# Patient Record
Sex: Male | Born: 2000 | Race: White | Hispanic: Yes | Marital: Single | Smoking: Never smoker
Health system: Southern US, Community
[De-identification: ages and names within clinical notes are randomized; demographics above are authoritative.]

---
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
## 2022-10-04 IMAGING — CR DG CHEST 2V
2 series · 2 of 2 positions shown · non-contrast
Comparison: None.

CLINICAL DATA: Pt c/o back pain and cough x 1 day. No hx of heart
or lung problems.

EXAM:
CHEST - 2 VIEW

[chest pa]
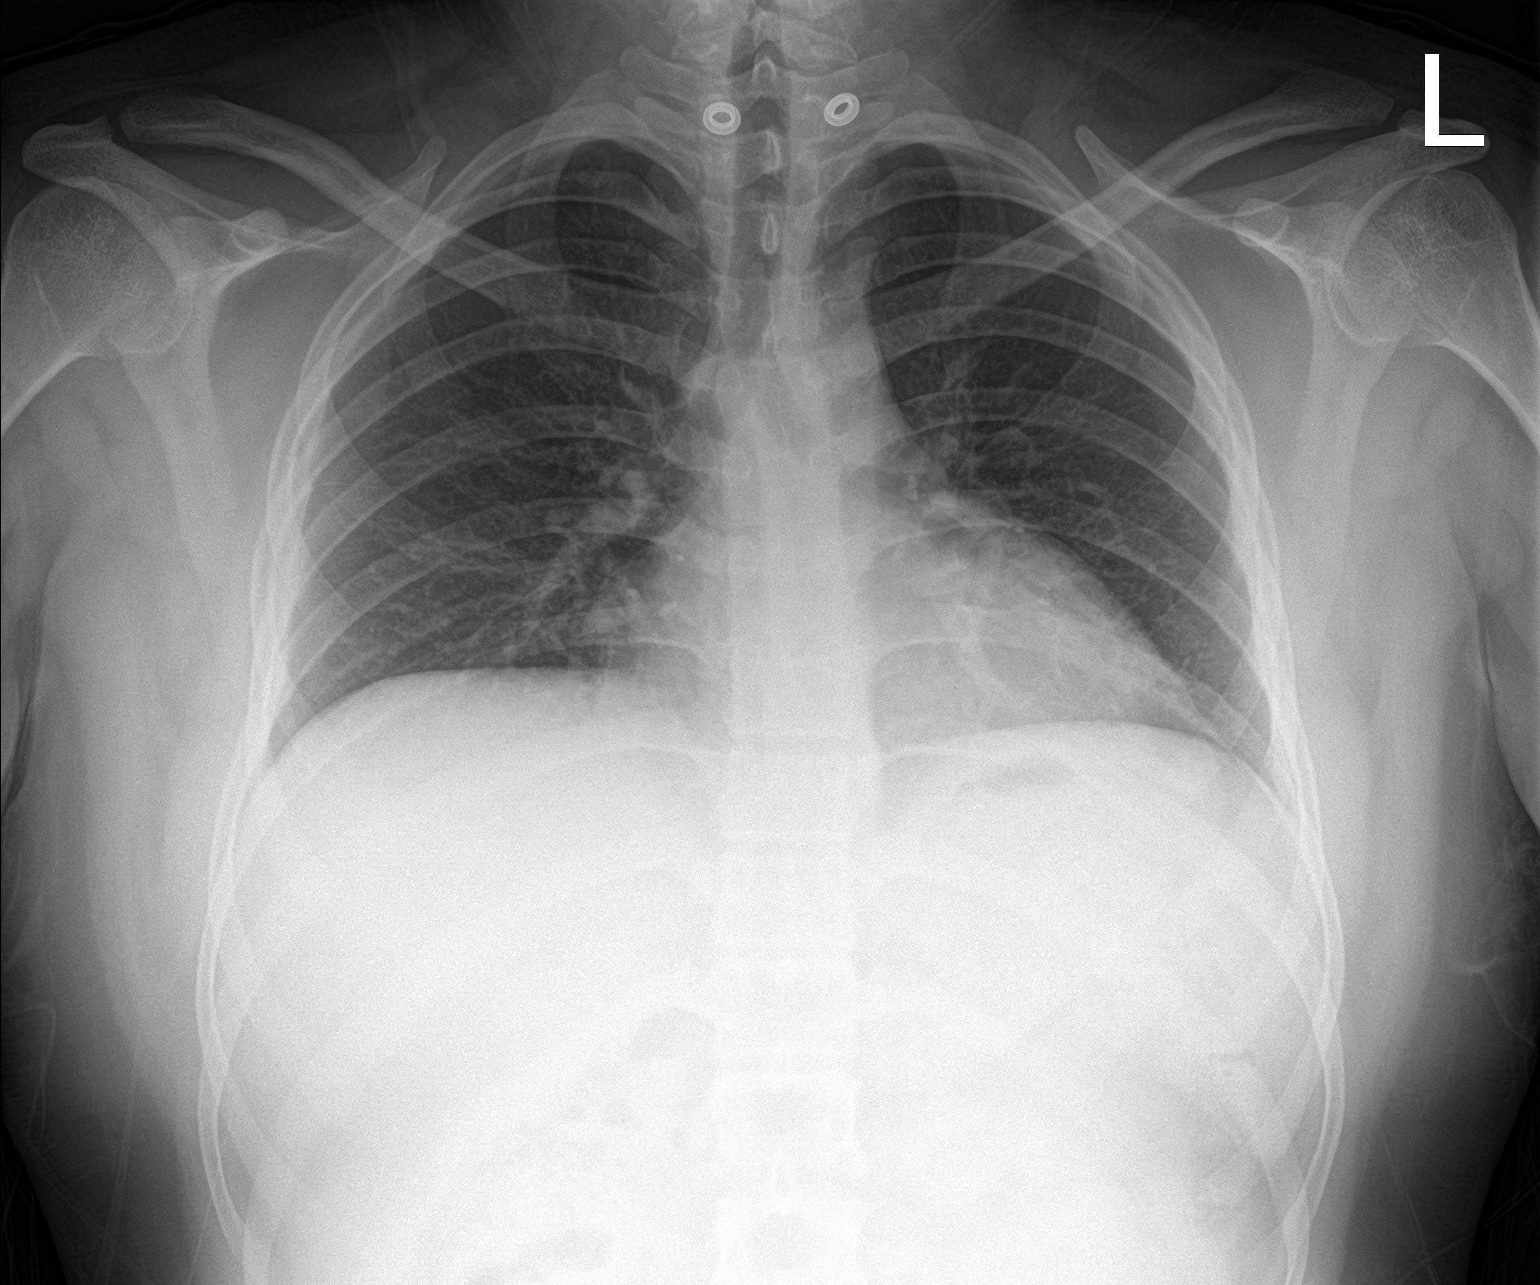

[chest lat]
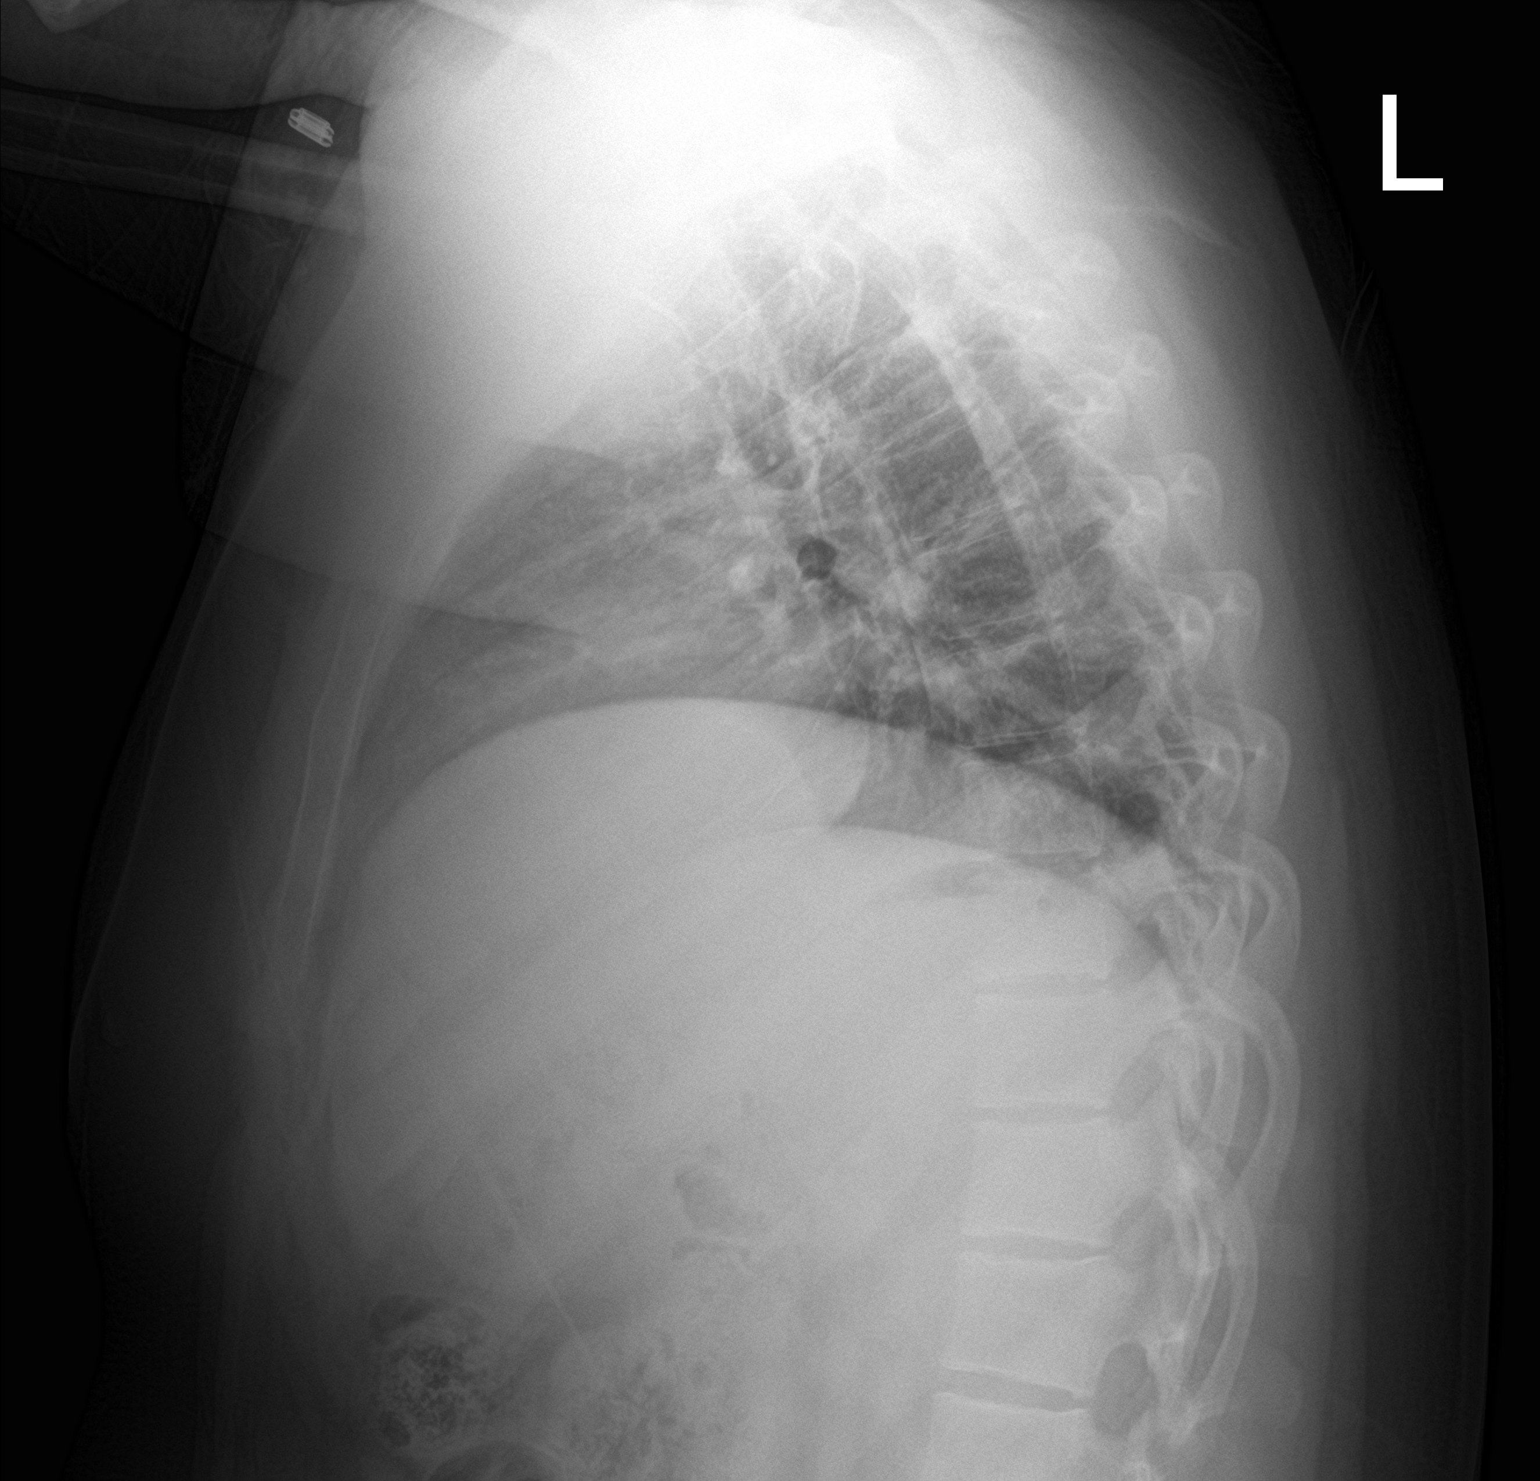

[2 of 2 positions shown; findings below may reference images not displayed]

FINDINGS: Normal heart, mediastinum and hila.

Lung volumes are low.  Lungs are clear.

No pleural effusion or pneumothorax.

Skeletal structures are unremarkable.
IMPRESSION: Normal chest radiographs.

## 2023-12-21 ENCOUNTER — Other Ambulatory Visit: Payer: Self-pay

## 2023-12-21 ENCOUNTER — Emergency Department (HOSPITAL_COMMUNITY)
Admission: EM | Admit: 2023-12-21 | Discharge: 2023-12-21 | Disposition: A | Discharge status: Home / Self Care | Attending: Emergency Medicine | Admitting: Emergency Medicine

## 2023-12-21 ENCOUNTER — Emergency Department (HOSPITAL_COMMUNITY)

## 2023-12-21 DIAGNOSIS — S61051A Open bite of right thumb without damage to nail, initial encounter: Secondary | ICD-10-CM | POA: Insufficient documentation

## 2023-12-21 DIAGNOSIS — S61011A Laceration without foreign body of right thumb without damage to nail, initial encounter: Secondary | ICD-10-CM | POA: Diagnosis not present

## 2023-12-21 DIAGNOSIS — S61452A Open bite of left hand, initial encounter: Secondary | ICD-10-CM | POA: Insufficient documentation

## 2023-12-21 DIAGNOSIS — Z23 Encounter for immunization: Secondary | ICD-10-CM | POA: Diagnosis not present

## 2023-12-21 DIAGNOSIS — S6991XA Unspecified injury of right wrist, hand and finger(s), initial encounter: Secondary | ICD-10-CM | POA: Diagnosis present

## 2023-12-21 DIAGNOSIS — W540XXA Bitten by dog, initial encounter: Secondary | ICD-10-CM | POA: Insufficient documentation

## 2023-12-21 LAB — CBG MONITORING, ED
Glucose-Capillary: 100 mg/dL — ABNORMAL HIGH (ref 70–99)
Glucose-Capillary: 89 mg/dL (ref 70–99)

## 2023-12-21 MED ORDER — AMOXICILLIN-POT CLAVULANATE 875-125 MG PO TABS: 1.0000 | ORAL_TABLET | Freq: Two times a day (BID) | ORAL | 0 refills | Status: AC

## 2023-12-21 MED ORDER — IBUPROFEN 400 MG PO TABS
600.0000 mg | ORAL_TABLET | Freq: Once | ORAL | Status: AC
  Administered 2023-12-21: 600 mg via ORAL
  Filled 2023-12-21: qty 1

## 2023-12-21 MED ORDER — AMOXICILLIN-POT CLAVULANATE 875-125 MG PO TABS
1.0000 | ORAL_TABLET | Freq: Once | ORAL | Status: AC
  Administered 2023-12-21: 1 via ORAL
  Filled 2023-12-21: qty 1

## 2023-12-21 MED ORDER — LIDOCAINE-EPINEPHRINE-TETRACAINE (LET) TOPICAL GEL
3.0000 mL | Freq: Once | TOPICAL | Status: AC
  Administered 2023-12-21: 3 mL via TOPICAL
  Filled 2023-12-21: qty 3

## 2023-12-21 MED ORDER — TETANUS-DIPHTH-ACELL PERTUSSIS 5-2.5-18.5 LF-MCG/0.5 IM SUSY
0.5000 mL | PREFILLED_SYRINGE | Freq: Once | INTRAMUSCULAR | Status: AC
  Administered 2023-12-21: 0.5 mL via INTRAMUSCULAR
  Filled 2023-12-21: qty 0.5

## 2023-12-21 MED ORDER — OXYCODONE-ACETAMINOPHEN 5-325 MG PO TABS
1.0000 | ORAL_TABLET | Freq: Once | ORAL | Status: AC
  Administered 2023-12-21: 1 via ORAL
  Filled 2023-12-21: qty 1

## 2023-12-21 NOTE — ED Provider Notes (Signed)
 [place] EMERGENCY DEPARTMENT AT [facility] Provider Note   CSN: [id number] Arrival date & time: 12/21/23  1521     Patient presents with: No chief complaint on file.   Anthony Kelley is a 23 y.o. male here with dog bite to right thumb and left hand.  This is his domestic dog.  Vaccines up to date.  No other complaints.   HPI     Prior to Admission medications   Medication Sig Start Date End Date Taking? Authorizing Provider  amoxicillin -clavulanate (AUGMENTIN ) 875-125 MG tablet Take 1 tablet by mouth every 12 (twelve) hours for 5 days. 12/22/23 12/27/23 Yes Aseneth Hack, Donnice PARAS, MD    Allergies: Patient has no known allergies.    Review of Systems  Updated Vital Signs BP 135/83 (BP Location: Left Arm)   Pulse 93   Temp 98.2 F (36.8 C)   Resp 17   Ht 5' 6 (1.676 m)   SpO2 99%   BMI 20.53 kg/m   Physical Exam Constitutional:      General: He is not in acute distress. HENT:     Head: Normocephalic and atraumatic.  Eyes:     Conjunctiva/sclera: Conjunctivae normal.     Pupils: Pupils are equal, round, and reactive to light.  Cardiovascular:     Rate and Rhythm: Normal rate and regular rhythm.  Pulmonary:     Effort: Pulmonary effort is normal. No respiratory distress.  Abdominal:     General: There is no distension.     Tenderness: There is no abdominal tenderness.  Skin:    General: Skin is warm and dry.     Comments: Laceration on pad of right thumb, gaping without bleeding, small punctate wound left hand  Neurological:     General: No focal deficit present.     Mental Status: He is alert. Mental status is at baseline.  Psychiatric:        Mood and Affect: Mood normal.        Behavior: Behavior normal.     (all labs ordered are listed, but only abnormal results are displayed) Labs Reviewed  CBG MONITORING, ED - Abnormal; Notable for the following components:      Result Value   Glucose-Capillary 100 (*)    All other components within  normal limits  CBG MONITORING, ED    EKG: None  Radiology: DG Hand Complete Left Result Date: 12/21/2023 CLINICAL DATA:  Multiple left hand scratches from a dog bite. EXAM: LEFT HAND - COMPLETE 3+ VIEW COMPARISON:  Right hand radiographs obtained at the same time. FINDINGS: There is no evidence of fracture or dislocation. There is no evidence of arthropathy or other focal bone abnormality. Soft tissues are unremarkable. IMPRESSION: Negative. Electronically Signed   By: Elspeth Bathe M.D.   On: 12/21/2023 17:42   DG Hand Complete Right Result Date: 12/21/2023 CLINICAL DATA:  Right thumb dog bite. EXAM: RIGHT HAND - COMPLETE 3+ VIEW COMPARISON:  None Available. FINDINGS: Right thumb bandage material. Diffuse soft tissue swelling. No fracture, dislocation or radiopaque foreign body. IMPRESSION: Diffuse soft tissue swelling. No fracture or radiopaque foreign body. Electronically Signed   By: Elspeth Bathe M.D.   On: 12/21/2023 17:42     .Laceration Repair  Date/Time: 12/21/2023 10:32 PM  Performed by: Cottie Donnice PARAS, MD Authorized by: Cottie Donnice PARAS, MD   Consent:    Consent obtained:  Verbal   Consent given by:  Patient   Risks discussed:  Poor cosmetic result, poor  wound healing and pain Universal protocol:    Procedure explained and questions answered to patient or proxy's satisfaction: yes     Relevant documents present and verified: yes     Test results available: yes     Imaging studies available: yes     Required blood products, implants, devices, and special equipment available: yes     Site/side marked: yes     Immediately prior to procedure, a time out was called: yes     Patient identity confirmed:  Arm band Anesthesia:    Anesthesia method:  Topical application Laceration details:    Location:  Finger   Finger location:  R thumb   Length (cm):  4 Pre-procedure details:    Preparation:  Imaging obtained to evaluate for foreign bodies Exploration:    Imaging  obtained: x-ray     Imaging outcome: foreign body not noted     Wound exploration: wound explored through full range of motion     Wound extent: no foreign body and no signs of injury     Contaminated: no   Treatment:    Area cleansed with:  Saline   Amount of cleaning:  Standard   Irrigation solution:  Sterile saline   Debridement:  None   Undermining:  None Skin repair:    Repair method:  Sutures   Suture size:  5-0   Suture material:  Prolene   Suture technique:  Simple interrupted   Number of sutures:  4 Approximation:    Approximation:  Close Repair type:    Repair type:  Simple Post-procedure details:    Procedure completion:  Tolerated well, no immediate complications    Medications Ordered in the ED  amoxicillin -clavulanate (AUGMENTIN ) 875-125 MG per tablet 1 tablet (1 tablet Oral Given 12/21/23 2146)  oxyCODONE -acetaminophen  (PERCOCET/ROXICET) 5-325 MG per tablet 1 tablet (1 tablet Oral Given 12/21/23 2146)  ibuprofen  (ADVIL ) tablet 600 mg (600 mg Oral Given 12/21/23 2146)  lidocaine -EPINEPHrine -tetracaine  (LET) topical gel (3 mLs Topical Given 12/21/23 2146)  Tdap (BOOSTRIX ) injection 0.5 mL (0.5 mLs Intramuscular Given 12/21/23 2146)                                    Medical Decision Making Amount and/or Complexity of Data Reviewed Radiology: ordered.  Risk Prescription drug management.   Wound irrigated, tdap updated Start on augmentin  given dog bite Xrays reviewed - no retained foreign body This does not look clinically like tenosynovitis, felon, or spreading infection  No indication for rabies series as dog is up to date       Final diagnoses:  Dog bite, initial encounter    ED Discharge Orders          Ordered    amoxicillin -clavulanate (AUGMENTIN ) 875-125 MG tablet  Every 12 hours        12/21/23 2232               Cottie Donnice PARAS, MD 12/21/23 2233

## 2023-12-21 NOTE — ED Triage Notes (Addendum)
 Pt states that he was bitten by his dog when he was going to feed tham & he has multiple scratched on his hands & the pad of his Rt thumb has a lac across it. A/Ox4, rates his pain 9/10. Pt reports he was told he is pre-diabetic & was asking if his CBG can be checked as well.

## 2023-12-21 NOTE — Discharge Instructions (Addendum)
 Your 4 stitches should be removed in 7 days. Keep them dry for 48 hours, then you can wash with regular soap and water.  Take motrin  (ibuprofen ) at home with tylenol  for pain.  Take the antibiotics prescribed as well.

## 2023-12-21 NOTE — ED Notes (Signed)
 Nonstick dressing applied to right thumb and post wound care discussed. Given some extra supplies. Verbalizes understanding.

## 2023-12-21 NOTE — ED Triage Notes (Signed)
 Pt came in for dog bite to right thumb today. It is the pt's dog.

## 2023-12-21 NOTE — ED Notes (Signed)
 Pt given a snack with ABX. LET placed. Pt tolerated without issues.

## 2023-12-28 ENCOUNTER — Emergency Department (HOSPITAL_COMMUNITY)
Admission: EM | Admit: 2023-12-28 | Discharge: 2023-12-28 | Disposition: A | Discharge status: Home / Self Care | Attending: Emergency Medicine | Admitting: Emergency Medicine

## 2023-12-28 ENCOUNTER — Telehealth (HOSPITAL_COMMUNITY): Payer: Self-pay | Admitting: Emergency Medicine

## 2023-12-28 ENCOUNTER — Other Ambulatory Visit: Payer: Self-pay

## 2023-12-28 ENCOUNTER — Encounter (HOSPITAL_COMMUNITY): Payer: Self-pay

## 2023-12-28 DIAGNOSIS — Z4802 Encounter for removal of sutures: Secondary | ICD-10-CM | POA: Insufficient documentation

## 2023-12-28 MED ORDER — PREDNISONE 10 MG PO TABS: ORAL_TABLET | ORAL | 0 refills | Status: DC

## 2023-12-28 NOTE — ED Notes (Signed)
 Removed 4 sutures from right thumb. No redness or swelling.

## 2023-12-28 NOTE — ED Triage Notes (Signed)
 Pt. Stated, here to get sutures out of rt. thumb

## 2023-12-28 NOTE — ED Provider Notes (Deleted)
 [place] EMERGENCY DEPARTMENT AT [facility] Provider Note   CSN: [id number] Arrival date & time: 12/28/23  1017     Patient presents with: Rash History obtained through Spanish interpreter  Anthony Kelley is a 23 y.o. male presents with concern for an itchy rash to the upper chest and back that started 3 days ago.  He denies any rash that spread to his mouth, arms, or legs.  Reports he ate lamb on Monday which was abnormal for him, but otherwise denies any new soaps, lotions, or medications.  He did just recently finished a course of Augmentin  for a dog bite, but rash did not start so after completing this medication.  Denies any systemic symptoms such as fever or chills.    Rash      Prior to Admission medications   Medication Sig Start Date End Date Taking? Authorizing Provider  predniSONE  (DELTASONE ) 10 MG tablet Take 4 tablets (40 mg total) by mouth daily with breakfast for 2 days, THEN 3 tablets (30 mg total) daily with breakfast for 2 days, THEN 2 tablets (20 mg total) daily with breakfast for 2 days, THEN 1 tablet (10 mg total) daily with breakfast for 1 day. 12/28/23 01/04/24 Yes Veta Palma, PA-C    Allergies: Patient has no known allergies.    Review of Systems  Skin:  Positive for rash.    Updated Vital Signs BP 132/78 (BP Location: Left Arm)   Pulse 66   Temp 98.5 F (36.9 C) (Oral)   Resp 16   Ht 5' 10.87 (1.8 m)   Wt 83 kg   SpO2 100%   BMI 25.62 kg/m   Physical Exam Vitals and nursing note reviewed.  Constitutional:      Appearance: Normal appearance.  HENT:     Head: Atraumatic.     Mouth/Throat:     Mouth: Mucous membranes are moist.     Pharynx: No posterior oropharyngeal erythema.     Comments: No edema of the lips, tongue, posterior pharynx.  Swallowing secretions without difficulty.  No rash in the mouth. Cardiovascular:     Rate and Rhythm: Normal rate and regular rhythm.     Comments: Radial pulse 2+  bilaterally Pulmonary:     Effort: Pulmonary effort is normal.     Breath sounds: Normal breath sounds.  Musculoskeletal:     Comments: Patient with right thumb dog bite well-healed without any signs of infection.  Patient has full range of motion of the right thumb.  Intact sensation of the right thumb.  Skin:    Comments: Urticarial appearing rash on the back in patient's upper chest.  Rash also extends down onto the patient's buttocks bilaterally  Neurological:     General: No focal deficit present.     Mental Status: He is alert.  Psychiatric:        Mood and Affect: Mood normal.        Behavior: Behavior normal.            (all labs ordered are listed, but only abnormal results are displayed) Labs Reviewed - No data to display  EKG: None  Radiology: No results found.   Procedures   Medications Ordered in the ED - No data to display                                  Medical Decision Making Risk Prescription drug management.  Differential diagnosis includes but is not limited to urticaria, contact dermatitis, SJS, DRESS, suture removal  ED Course:  Upon initial evaluation, patient is appearing, no acute distress.  Stable vitals.  Has urticarial appearing rash on the back, buttocks, and upper chest has been ongoing for past 3 days.  No edema of the lips, tongue, posterior pharynx.  Tolerating oral secretions without difficulty.  No shortness of breath.  No hypotension, vomiting, no signs of anaphylaxis.  No skin peeling or blistering, no systemic symptoms, no concern for emergent dermatologic condition such as SJS or DRESS.  Unclear trigger, but does sound like he had some new foods and medications recently. Likely allergic in nature/contact dermatitis. Given diffuse nature on the chest and back, will treat with course of prednisone . I also reviewed patient's chart and it appears he was in the ER on 12/21/2023 for a dog bite.  Per Dr. Axel note, 4 simple  interrupted Prolene sutures were placed in the patient's right thumb.  However, on exam, there are no sutures noted in patient's right thumb.  Patient denies any sutures were placed.  It is unclear if he may have removed them on his own.  Picture above of patient's right thumb, no sutures to remove currently.  Dog bite is well-healed without any signs of infection.  Full range of motion of the right thumb.  Neurovascular intact in the right thumb.  No concern for any further injury  Patient stable and appropriate for discharge home at this time   Impression: Urticarial rash  Disposition:  The patient was discharged home with instructions to take 7-day course of prednisone  as prescribed.  May take Benadryl at home as needed for rash and itching.  Follow-up with PCP if rash not improved by the end of the prednisone  taper. Return precautions given.    Record Review: External records from outside source obtained and reviewed including ER note from 7/23     This chart was dictated using voice recognition software, Dragon. Despite the best efforts of this provider to proofread and correct errors, errors may still occur which can change documentation meaning.       Final diagnoses:  Rash    ED Discharge Orders          Ordered    predniSONE  (DELTASONE ) 10 MG tablet  Q breakfast        12/28/23 1309               Veta Palma, PA-C 12/28/23 1338    Veta Palma, PA-C 12/28/23 1339

## 2023-12-28 NOTE — ED Provider Notes (Signed)
 [place] EMERGENCY DEPARTMENT AT [facility] Provider Note  CSN: [id number]  Arrival date & time: 12/28/23 1004  Chief Complaint(s) Suture / Staple Removal and Wound Check  HPI Anthony Kelley is a 23 y.o. male with past medical history as below, significant for no significant medical history who presents to the ED with complaint of suture removal  Patient reports that he went to see a doctor and had the wound clean, sutures placed.  Started antibiotics.  Reports that he was bitten by his own dog.  Dog is up-to-date immunizations.  He feels the wound has been healing appropriately, no redness or drainage from the wound, no pain with moving his thumb.  No fevers, chills, nausea or vomiting.  Reports he is doing well overall. Pt reports he does not need spanish interpreter and speaks english very well   Past Medical History No past medical history on file. There are no active problems to display for this patient.  Home Medication(s) Prior to Admission medications   na                                                                                                                                                       Past Surgical History No past surgical history on file. Family History No family history on file.  Social History Social History   Tobacco Use   Smoking status: Never   Smokeless tobacco: Never  Vaping Use   Vaping status: Never Used  Substance Use Topics   Alcohol use: Yes    Comment: occa   Drug use: Never   Allergies Patient has no known allergies.  Review of Systems A thorough review of systems was obtained and all systems are negative except as noted in the HPI and PMH.   Physical Exam Vital Signs  I have reviewed the triage vital signs BP 132/78 (BP Location: Left Arm)   Pulse 66   Temp 98.5 F (36.9 C) (Oral)   Resp 16   Ht 5' 10.87 (1.8 m)   Wt 83 kg   SpO2 100%   BMI 25.62 kg/m  Physical Exam Vitals and nursing note reviewed.   Constitutional:      General: He is not in acute distress.    Appearance: Normal appearance. He is well-developed. He is not ill-appearing.  HENT:     Head: Normocephalic and atraumatic.     Right Ear: External ear normal.     Left Ear: External ear normal.     Nose: Nose normal.     Mouth/Throat:     Mouth: Mucous membranes are moist.  Eyes:     General: No scleral icterus.       Right eye: No discharge.        Left eye: No discharge.  Cardiovascular:  Rate and Rhythm: Normal rate.  Pulmonary:     Effort: Pulmonary effort is normal. No respiratory distress.     Breath sounds: No stridor.  Abdominal:     General: Abdomen is flat. There is no distension.     Tenderness: There is no guarding.  Musculoskeletal:        General: No deformity.       Hands:     Cervical back: No rigidity.  Skin:    General: Skin is warm and dry.     Coloration: Skin is not cyanotic, jaundiced or pale.  Neurological:     Mental Status: He is alert and oriented to person, place, and time.     GCS: GCS eye subscore is 4. GCS verbal subscore is 5. GCS motor subscore is 6.  Psychiatric:        Speech: Speech normal.        Behavior: Behavior normal. Behavior is cooperative.     ED Results and Treatments Labs (all labs ordered are listed, but only abnormal results are displayed) Labs Reviewed - No data to display                                                                                                                        Radiology No results found.  Pertinent labs & imaging results that were available during my care of the patient were reviewed by me and considered in my medical decision making (see MDM for details).  Medications Ordered in ED Medications - No data to display                                                                                                                                   Procedures Procedures  (including critical care time)  Medical Decision  Making / ED Course    Medical Decision Making:    Anthony Kelley is a 23 y.o. male  with past medical history as below, significant for no significant medical history who presents to the ED with complaint of suture removal. The complaint involves an extensive differential diagnosis and also carries with it a high risk of complications and morbidity.  Serious etiology was considered. Ddx includes but is not limited to: Localized infection, wound dehiscence, cellulitis, etc.  Complete initial physical exam performed, notably the patient was in no distress, sitting comfortably.    Reviewed and confirmed nursing documentation for past  medical history, family history, social history.  Vital signs reviewed.    Suture removal  > - Wound appears to be healing appropriately - Sutures removed, bandage applied, wound care instructions  - Advised to follow-up with PCP       Patient in no distress and overall condition is stable. Detailed discussions were had with the patient/guardian regarding current findings, and need for close f/u with PCP or on call doctor. The patient/guardian has been instructed to return immediately if the symptoms worsen in any way for re-evaluation. Patient/guardian verbalized understanding and is in agreement with current care plan. All questions answered prior to discharge.                Additional history obtained: -Additional history obtained from na -External records from outside source obtained and reviewed including: Chart review including previous notes, labs, imaging, consultation notes including  pdmp   Lab Tests: na  EKG   EKG Interpretation Date/Time:    Ventricular Rate:    PR Interval:    QRS Duration:    QT Interval:    QTC Calculation:   R Axis:      Text Interpretation:           Imaging Studies ordered: na   Medicines ordered and prescription drug management: No orders of the defined types were placed in this  encounter.   -I have reviewed the patients home medicines and have made adjustments as needed   Consultations Obtained: na   Cardiac Monitoring: Continuous pulse oximetry interpreted by myself, 100% on ra.    Social Determinants of Health:  Diagnosis or treatment significantly limited by social determinants of health: no pcp   Reevaluation: After the interventions noted above, I reevaluated the patient and found that they have resolved  Co morbidities that complicate the patient evaluation No past medical history on file.    Dispostion: Disposition decision including need for hospitalization was considered, and patient discharged from emergency department.    Final Clinical Impression(s) / ED Diagnoses Final diagnoses:  Visit for suture removal        Elnor Jayson LABOR, DO 12/28/23 1144   Elnor Jayson LABOR, DO 12/28/23 1523

## 2023-12-28 NOTE — ED Triage Notes (Signed)
 Patient here for suture removal to right hand.

## 2023-12-28 NOTE — Discharge Instructions (Addendum)
 It was a pleasure caring for you today in the emergency department.   please follow-up with your PCP for recheck, please return if you notice any redness or drainage or significant pain to the affected area.  Please return to the emergency department for any worsening or worrisome symptoms.

## 2023-12-28 NOTE — ED Notes (Deleted)
   The note originally documented on this encounter has been moved the the encounter in which it belongs.

## 2023-12-28 NOTE — ED Triage Notes (Deleted)
   The note originally documented on this encounter has been moved the the encounter in which it belongs.
